# Patient Record
Sex: Male | Born: 1965 | Hispanic: Yes | Marital: Married | Smoking: Never smoker
Health system: Southern US, Community
[De-identification: ages and names within clinical notes are randomized; demographics above are authoritative.]

## PROBLEM LIST (undated history)

## (undated) DIAGNOSIS — I10 Essential (primary) hypertension: Secondary | ICD-10-CM

---

## 2006-07-21 ENCOUNTER — Emergency Department: Payer: Self-pay | Admitting: Emergency Medicine

## 2011-06-02 ENCOUNTER — Ambulatory Visit: Payer: Self-pay | Admitting: Family Medicine

## 2012-12-29 ENCOUNTER — Emergency Department: Payer: Self-pay | Admitting: Emergency Medicine

## 2012-12-29 LAB — URINALYSIS, COMPLETE
Bacteria: NONE SEEN
Blood: NEGATIVE
Nitrite: NEGATIVE
Ph: 5 (ref 4.5–8.0)
Squamous Epithelial: NONE SEEN

## 2012-12-31 ENCOUNTER — Ambulatory Visit: Payer: Self-pay | Admitting: Family Medicine

## 2013-08-02 DIAGNOSIS — I4891 Unspecified atrial fibrillation: Secondary | ICD-10-CM | POA: Diagnosis present
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
## 2016-04-26 DIAGNOSIS — I712 Thoracic aortic aneurysm, without rupture: Secondary | ICD-10-CM | POA: Diagnosis not present

## 2016-04-26 DIAGNOSIS — I480 Paroxysmal atrial fibrillation: Secondary | ICD-10-CM | POA: Diagnosis not present

## 2016-04-26 DIAGNOSIS — I359 Nonrheumatic aortic valve disorder, unspecified: Secondary | ICD-10-CM | POA: Diagnosis not present

## 2016-05-12 DIAGNOSIS — I4891 Unspecified atrial fibrillation: Secondary | ICD-10-CM | POA: Diagnosis not present

## 2016-05-25 DIAGNOSIS — I359 Nonrheumatic aortic valve disorder, unspecified: Secondary | ICD-10-CM | POA: Diagnosis not present

## 2016-05-27 DIAGNOSIS — I359 Nonrheumatic aortic valve disorder, unspecified: Secondary | ICD-10-CM | POA: Diagnosis not present

## 2016-05-27 DIAGNOSIS — J069 Acute upper respiratory infection, unspecified: Secondary | ICD-10-CM | POA: Diagnosis not present

## 2016-06-08 DIAGNOSIS — I359 Nonrheumatic aortic valve disorder, unspecified: Secondary | ICD-10-CM | POA: Diagnosis not present

## 2016-06-16 DIAGNOSIS — I4891 Unspecified atrial fibrillation: Secondary | ICD-10-CM | POA: Diagnosis not present

## 2016-06-29 DIAGNOSIS — H6692 Otitis media, unspecified, left ear: Secondary | ICD-10-CM | POA: Diagnosis not present

## 2016-06-29 DIAGNOSIS — J069 Acute upper respiratory infection, unspecified: Secondary | ICD-10-CM | POA: Diagnosis not present

## 2016-06-29 DIAGNOSIS — Z7901 Long term (current) use of anticoagulants: Secondary | ICD-10-CM | POA: Diagnosis not present

## 2016-06-29 DIAGNOSIS — I38 Endocarditis, valve unspecified: Secondary | ICD-10-CM | POA: Diagnosis not present

## 2016-07-26 DIAGNOSIS — I4891 Unspecified atrial fibrillation: Secondary | ICD-10-CM | POA: Diagnosis not present

## 2016-08-13 DIAGNOSIS — I4891 Unspecified atrial fibrillation: Secondary | ICD-10-CM | POA: Diagnosis not present

## 2016-08-26 DIAGNOSIS — Z5181 Encounter for therapeutic drug level monitoring: Secondary | ICD-10-CM | POA: Diagnosis not present

## 2016-08-26 DIAGNOSIS — Z7901 Long term (current) use of anticoagulants: Secondary | ICD-10-CM | POA: Diagnosis not present

## 2016-09-14 DIAGNOSIS — I4891 Unspecified atrial fibrillation: Secondary | ICD-10-CM | POA: Diagnosis not present

## 2016-09-28 DIAGNOSIS — I4891 Unspecified atrial fibrillation: Secondary | ICD-10-CM | POA: Diagnosis not present

## 2016-10-11 DIAGNOSIS — Z5181 Encounter for therapeutic drug level monitoring: Secondary | ICD-10-CM | POA: Diagnosis not present

## 2016-10-11 DIAGNOSIS — Z7901 Long term (current) use of anticoagulants: Secondary | ICD-10-CM | POA: Diagnosis not present

## 2016-11-04 DIAGNOSIS — Z8774 Personal history of (corrected) congenital malformations of heart and circulatory system: Secondary | ICD-10-CM | POA: Diagnosis not present

## 2016-11-04 DIAGNOSIS — I4891 Unspecified atrial fibrillation: Secondary | ICD-10-CM | POA: Diagnosis not present

## 2016-11-04 DIAGNOSIS — I350 Nonrheumatic aortic (valve) stenosis: Secondary | ICD-10-CM | POA: Diagnosis not present

## 2016-11-28 DIAGNOSIS — Z23 Encounter for immunization: Secondary | ICD-10-CM | POA: Diagnosis not present

## 2016-11-29 DIAGNOSIS — I4891 Unspecified atrial fibrillation: Secondary | ICD-10-CM | POA: Diagnosis not present

## 2016-12-29 DIAGNOSIS — Z5181 Encounter for therapeutic drug level monitoring: Secondary | ICD-10-CM | POA: Diagnosis not present

## 2016-12-29 DIAGNOSIS — Z7901 Long term (current) use of anticoagulants: Secondary | ICD-10-CM | POA: Diagnosis not present

## 2017-01-29 DIAGNOSIS — J019 Acute sinusitis, unspecified: Secondary | ICD-10-CM | POA: Diagnosis not present

## 2017-01-29 DIAGNOSIS — Z7901 Long term (current) use of anticoagulants: Secondary | ICD-10-CM | POA: Diagnosis not present

## 2017-01-29 DIAGNOSIS — B9689 Other specified bacterial agents as the cause of diseases classified elsewhere: Secondary | ICD-10-CM | POA: Diagnosis not present

## 2017-02-02 DIAGNOSIS — Z952 Presence of prosthetic heart valve: Secondary | ICD-10-CM | POA: Diagnosis not present

## 2017-02-14 DIAGNOSIS — Z7901 Long term (current) use of anticoagulants: Secondary | ICD-10-CM | POA: Diagnosis not present

## 2017-03-15 DIAGNOSIS — Z952 Presence of prosthetic heart valve: Secondary | ICD-10-CM | POA: Diagnosis not present

## 2017-04-19 DIAGNOSIS — Z952 Presence of prosthetic heart valve: Secondary | ICD-10-CM | POA: Diagnosis not present

## 2017-04-21 DIAGNOSIS — I10 Essential (primary) hypertension: Secondary | ICD-10-CM | POA: Diagnosis not present

## 2017-04-28 DIAGNOSIS — Z8774 Personal history of (corrected) congenital malformations of heart and circulatory system: Secondary | ICD-10-CM | POA: Diagnosis not present

## 2017-04-28 DIAGNOSIS — Z0000 Encounter for general adult medical examination without abnormal findings: Secondary | ICD-10-CM | POA: Diagnosis not present

## 2017-05-25 DIAGNOSIS — Z952 Presence of prosthetic heart valve: Secondary | ICD-10-CM | POA: Diagnosis not present

## 2017-05-26 DIAGNOSIS — I359 Nonrheumatic aortic valve disorder, unspecified: Secondary | ICD-10-CM | POA: Diagnosis not present

## 2017-05-26 DIAGNOSIS — R791 Abnormal coagulation profile: Secondary | ICD-10-CM | POA: Diagnosis not present

## 2017-05-26 DIAGNOSIS — I350 Nonrheumatic aortic (valve) stenosis: Secondary | ICD-10-CM | POA: Diagnosis not present

## 2017-07-18 DIAGNOSIS — Z952 Presence of prosthetic heart valve: Secondary | ICD-10-CM | POA: Diagnosis not present

## 2017-07-18 DIAGNOSIS — K122 Cellulitis and abscess of mouth: Secondary | ICD-10-CM | POA: Diagnosis not present

## 2017-08-02 DIAGNOSIS — R791 Abnormal coagulation profile: Secondary | ICD-10-CM | POA: Diagnosis not present

## 2017-08-30 DIAGNOSIS — Z952 Presence of prosthetic heart valve: Secondary | ICD-10-CM | POA: Diagnosis not present

## 2017-10-06 DIAGNOSIS — R791 Abnormal coagulation profile: Secondary | ICD-10-CM | POA: Diagnosis not present

## 2017-10-20 DIAGNOSIS — Z8774 Personal history of (corrected) congenital malformations of heart and circulatory system: Secondary | ICD-10-CM | POA: Diagnosis not present

## 2017-10-20 DIAGNOSIS — Z0000 Encounter for general adult medical examination without abnormal findings: Secondary | ICD-10-CM | POA: Diagnosis not present

## 2017-10-20 DIAGNOSIS — R791 Abnormal coagulation profile: Secondary | ICD-10-CM | POA: Diagnosis not present

## 2017-11-12 DIAGNOSIS — M5442 Lumbago with sciatica, left side: Secondary | ICD-10-CM | POA: Diagnosis not present

## 2017-11-23 DIAGNOSIS — I749 Embolism and thrombosis of unspecified artery: Secondary | ICD-10-CM | POA: Diagnosis not present

## 2017-11-23 DIAGNOSIS — I359 Nonrheumatic aortic valve disorder, unspecified: Secondary | ICD-10-CM | POA: Diagnosis not present

## 2017-11-23 DIAGNOSIS — I480 Paroxysmal atrial fibrillation: Secondary | ICD-10-CM | POA: Diagnosis not present

## 2017-12-22 DIAGNOSIS — I10 Essential (primary) hypertension: Secondary | ICD-10-CM | POA: Diagnosis not present

## 2017-12-22 DIAGNOSIS — Z952 Presence of prosthetic heart valve: Secondary | ICD-10-CM | POA: Diagnosis not present

## 2017-12-22 DIAGNOSIS — R5383 Other fatigue: Secondary | ICD-10-CM | POA: Diagnosis not present

## 2017-12-22 DIAGNOSIS — R5381 Other malaise: Secondary | ICD-10-CM | POA: Diagnosis not present

## 2018-01-23 DIAGNOSIS — Z952 Presence of prosthetic heart valve: Secondary | ICD-10-CM | POA: Diagnosis not present

## 2018-02-20 DIAGNOSIS — R509 Fever, unspecified: Secondary | ICD-10-CM | POA: Diagnosis not present

## 2018-02-20 DIAGNOSIS — R52 Pain, unspecified: Secondary | ICD-10-CM | POA: Diagnosis not present

## 2018-02-20 DIAGNOSIS — J069 Acute upper respiratory infection, unspecified: Secondary | ICD-10-CM | POA: Diagnosis not present

## 2018-03-13 DIAGNOSIS — R791 Abnormal coagulation profile: Secondary | ICD-10-CM | POA: Diagnosis not present

## 2018-03-20 DIAGNOSIS — R791 Abnormal coagulation profile: Secondary | ICD-10-CM | POA: Diagnosis not present

## 2018-03-20 DIAGNOSIS — Z23 Encounter for immunization: Secondary | ICD-10-CM | POA: Diagnosis not present

## 2018-04-27 DIAGNOSIS — R791 Abnormal coagulation profile: Secondary | ICD-10-CM | POA: Diagnosis not present

## 2018-05-20 DIAGNOSIS — K644 Residual hemorrhoidal skin tags: Secondary | ICD-10-CM | POA: Diagnosis not present

## 2018-05-30 DIAGNOSIS — E538 Deficiency of other specified B group vitamins: Secondary | ICD-10-CM | POA: Diagnosis not present

## 2018-05-30 DIAGNOSIS — K644 Residual hemorrhoidal skin tags: Secondary | ICD-10-CM | POA: Diagnosis not present

## 2018-05-30 DIAGNOSIS — I480 Paroxysmal atrial fibrillation: Secondary | ICD-10-CM | POA: Diagnosis not present

## 2018-05-30 DIAGNOSIS — Z8774 Personal history of (corrected) congenital malformations of heart and circulatory system: Secondary | ICD-10-CM | POA: Diagnosis not present

## 2018-05-31 DIAGNOSIS — I480 Paroxysmal atrial fibrillation: Secondary | ICD-10-CM | POA: Diagnosis not present

## 2018-05-31 DIAGNOSIS — Z8774 Personal history of (corrected) congenital malformations of heart and circulatory system: Secondary | ICD-10-CM | POA: Diagnosis not present

## 2018-05-31 DIAGNOSIS — I359 Nonrheumatic aortic valve disorder, unspecified: Secondary | ICD-10-CM | POA: Diagnosis not present

## 2018-07-06 DIAGNOSIS — I480 Paroxysmal atrial fibrillation: Secondary | ICD-10-CM | POA: Diagnosis not present

## 2018-07-06 DIAGNOSIS — Z7901 Long term (current) use of anticoagulants: Secondary | ICD-10-CM | POA: Diagnosis not present

## 2018-08-08 DIAGNOSIS — Z7901 Long term (current) use of anticoagulants: Secondary | ICD-10-CM | POA: Diagnosis not present

## 2018-08-08 DIAGNOSIS — K922 Gastrointestinal hemorrhage, unspecified: Secondary | ICD-10-CM | POA: Diagnosis not present

## 2018-08-08 DIAGNOSIS — Z7982 Long term (current) use of aspirin: Secondary | ICD-10-CM | POA: Diagnosis not present

## 2018-08-08 DIAGNOSIS — K6289 Other specified diseases of anus and rectum: Secondary | ICD-10-CM | POA: Diagnosis not present

## 2018-08-08 DIAGNOSIS — Z5181 Encounter for therapeutic drug level monitoring: Secondary | ICD-10-CM | POA: Diagnosis not present

## 2018-08-08 DIAGNOSIS — K625 Hemorrhage of anus and rectum: Secondary | ICD-10-CM | POA: Diagnosis not present

## 2018-08-08 DIAGNOSIS — K644 Residual hemorrhoidal skin tags: Secondary | ICD-10-CM | POA: Diagnosis not present

## 2018-08-08 DIAGNOSIS — K649 Unspecified hemorrhoids: Secondary | ICD-10-CM | POA: Diagnosis not present

## 2018-08-09 DIAGNOSIS — K6289 Other specified diseases of anus and rectum: Secondary | ICD-10-CM | POA: Diagnosis not present

## 2018-08-09 DIAGNOSIS — K625 Hemorrhage of anus and rectum: Secondary | ICD-10-CM | POA: Diagnosis not present

## 2018-08-09 DIAGNOSIS — Z7901 Long term (current) use of anticoagulants: Secondary | ICD-10-CM | POA: Diagnosis not present

## 2018-08-09 DIAGNOSIS — Z7982 Long term (current) use of aspirin: Secondary | ICD-10-CM | POA: Diagnosis not present

## 2018-08-15 DIAGNOSIS — I480 Paroxysmal atrial fibrillation: Secondary | ICD-10-CM | POA: Diagnosis not present

## 2018-08-15 DIAGNOSIS — Z7901 Long term (current) use of anticoagulants: Secondary | ICD-10-CM | POA: Diagnosis not present

## 2018-08-22 DIAGNOSIS — D689 Coagulation defect, unspecified: Secondary | ICD-10-CM | POA: Diagnosis not present

## 2019-07-03 ENCOUNTER — Emergency Department
Admission: EM | Admit: 2019-07-03 | Discharge: 2019-07-03 | Disposition: A | Discharge status: Home / Self Care | Payer: BLUE CROSS/BLUE SHIELD | Attending: Student in an Organized Health Care Education/Training Program | Admitting: Student in an Organized Health Care Education/Training Program

## 2019-07-03 ENCOUNTER — Other Ambulatory Visit: Payer: Self-pay

## 2019-07-03 ENCOUNTER — Emergency Department: Payer: BLUE CROSS/BLUE SHIELD

## 2019-07-03 DIAGNOSIS — Y92410 Unspecified street and highway as the place of occurrence of the external cause: Secondary | ICD-10-CM | POA: Diagnosis not present

## 2019-07-03 DIAGNOSIS — Y999 Unspecified external cause status: Secondary | ICD-10-CM | POA: Insufficient documentation

## 2019-07-03 DIAGNOSIS — Y939 Activity, unspecified: Secondary | ICD-10-CM | POA: Diagnosis not present

## 2019-07-03 DIAGNOSIS — S60222A Contusion of left hand, initial encounter: Secondary | ICD-10-CM | POA: Diagnosis not present

## 2019-07-03 DIAGNOSIS — S6000XA Contusion of unspecified finger without damage to nail, initial encounter: Secondary | ICD-10-CM

## 2019-07-03 DIAGNOSIS — S6992XA Unspecified injury of left wrist, hand and finger(s), initial encounter: Secondary | ICD-10-CM | POA: Diagnosis present

## 2019-07-03 LAB — CBC
HCT: 45 % (ref 39.0–52.0)
Hemoglobin: 15.1 g/dL (ref 13.0–17.0)
MCH: 30.9 pg (ref 26.0–34.0)
MCHC: 33.6 g/dL (ref 30.0–36.0)
MCV: 92 fL (ref 80.0–100.0)
Platelets: 139 10*3/uL — ABNORMAL LOW (ref 150–400)
RBC: 4.89 MIL/uL (ref 4.22–5.81)
RDW: 13.4 % (ref 11.5–15.5)
WBC: 5.2 10*3/uL (ref 4.0–10.5)
nRBC: 0 % (ref 0.0–0.2)

## 2019-07-03 LAB — TROPONIN I (HIGH SENSITIVITY)
Troponin I (High Sensitivity): 11 ng/L (ref <18)
Troponin I (High Sensitivity): 11 ng/L (ref <18)

## 2019-07-03 LAB — BASIC METABOLIC PANEL
Anion gap: 7 (ref 5–15)
BUN: 26 mg/dL — ABNORMAL HIGH (ref 6–20)
CO2: 25 mmol/L (ref 22–32)
Calcium: 8.6 mg/dL — ABNORMAL LOW (ref 8.9–10.3)
Chloride: 111 mmol/L (ref 98–111)
Creatinine, Ser: 0.91 mg/dL (ref 0.61–1.24)
GFR calc Af Amer: >60 mL/min (ref >60)
GFR calc non Af Amer: >60 mL/min (ref >60)
Glucose, Bld: 98 mg/dL (ref 70–99)
Potassium: 3.8 mmol/L (ref 3.5–5.1)
Sodium: 143 mmol/L (ref 135–145)

## 2019-07-03 MED ORDER — SODIUM CHLORIDE 0.9% FLUSH: 3.0000 mL | Freq: Once | INTRAVENOUS | Status: DC
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
## 2021-10-02 DIAGNOSIS — Z952 Presence of prosthetic heart valve: Secondary | ICD-10-CM
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
## 2022-06-26 ENCOUNTER — Emergency Department: Payer: BC Managed Care – PPO

## 2022-06-26 ENCOUNTER — Other Ambulatory Visit: Payer: Self-pay

## 2022-06-26 ENCOUNTER — Observation Stay
Admission: EM | Admit: 2022-06-26 | Discharge: 2022-06-27 | Disposition: A | Discharge status: Home / Self Care | Payer: BC Managed Care – PPO | Attending: Internal Medicine | Admitting: Internal Medicine

## 2022-06-26 ENCOUNTER — Encounter: Payer: Self-pay | Admitting: Intensive Care

## 2022-06-26 DIAGNOSIS — I10 Essential (primary) hypertension: Secondary | ICD-10-CM | POA: Diagnosis present

## 2022-06-26 DIAGNOSIS — B159 Hepatitis A without hepatic coma: Secondary | ICD-10-CM | POA: Diagnosis present

## 2022-06-26 DIAGNOSIS — I471 Supraventricular tachycardia, unspecified: Secondary | ICD-10-CM | POA: Diagnosis present

## 2022-06-26 DIAGNOSIS — R7989 Other specified abnormal findings of blood chemistry: Secondary | ICD-10-CM | POA: Diagnosis not present

## 2022-06-26 DIAGNOSIS — Z8679 Personal history of other diseases of the circulatory system: Secondary | ICD-10-CM | POA: Diagnosis not present

## 2022-06-26 DIAGNOSIS — R9431 Abnormal electrocardiogram [ECG] [EKG]: Secondary | ICD-10-CM | POA: Diagnosis not present

## 2022-06-26 DIAGNOSIS — E872 Acidosis, unspecified: Secondary | ICD-10-CM | POA: Diagnosis not present

## 2022-06-26 DIAGNOSIS — Z7901 Long term (current) use of anticoagulants: Secondary | ICD-10-CM

## 2022-06-26 DIAGNOSIS — R000 Tachycardia, unspecified: Principal | ICD-10-CM

## 2022-06-26 DIAGNOSIS — R21 Rash and other nonspecific skin eruption: Secondary | ICD-10-CM | POA: Diagnosis not present

## 2022-06-26 DIAGNOSIS — I4891 Unspecified atrial fibrillation: Secondary | ICD-10-CM | POA: Diagnosis present

## 2022-06-26 DIAGNOSIS — Z952 Presence of prosthetic heart valve: Secondary | ICD-10-CM

## 2022-06-26 DIAGNOSIS — R072 Precordial pain: Secondary | ICD-10-CM | POA: Diagnosis not present

## 2022-06-26 DIAGNOSIS — R079 Chest pain, unspecified: Secondary | ICD-10-CM | POA: Diagnosis present

## 2022-06-26 DIAGNOSIS — I4892 Unspecified atrial flutter: Secondary | ICD-10-CM

## 2022-06-26 HISTORY — DX: Essential (primary) hypertension: I10

## 2022-06-26 LAB — CBC WITH DIFFERENTIAL/PLATELET
Abs Immature Granulocytes: 0.01 10*3/uL (ref 0.00–0.07)
Basophils Absolute: 0 10*3/uL (ref 0.0–0.1)
Basophils Relative: 0 %
Eosinophils Absolute: 0.4 10*3/uL (ref 0.0–0.5)
Eosinophils Relative: 7 %
HCT: 47 % (ref 39.0–52.0)
Hemoglobin: 15.5 g/dL (ref 13.0–17.0)
Immature Granulocytes: 0 %
Lymphocytes Relative: 26 %
Lymphs Abs: 1.7 10*3/uL (ref 0.7–4.0)
MCH: 29.8 pg (ref 26.0–34.0)
MCHC: 33 g/dL (ref 30.0–36.0)
MCV: 90.4 fL (ref 80.0–100.0)
Monocytes Absolute: 0.9 10*3/uL (ref 0.1–1.0)
Monocytes Relative: 14 %
Neutro Abs: 3.5 10*3/uL (ref 1.7–7.7)
Neutrophils Relative %: 53 %
Platelets: 202 10*3/uL (ref 150–400)
RBC: 5.2 MIL/uL (ref 4.22–5.81)
RDW: 13.5 % (ref 11.5–15.5)
WBC: 6.6 10*3/uL (ref 4.0–10.5)
nRBC: 0 % (ref 0.0–0.2)

## 2022-06-26 LAB — URINALYSIS, W/ REFLEX TO CULTURE (INFECTION SUSPECTED)
Bacteria, UA: NONE SEEN
Bilirubin Urine: NEGATIVE
Glucose, UA: NEGATIVE mg/dL
Hgb urine dipstick: NEGATIVE
Ketones, ur: NEGATIVE mg/dL
Leukocytes,Ua: NEGATIVE
Nitrite: NEGATIVE
Protein, ur: NEGATIVE mg/dL
Specific Gravity, Urine: 1.009 (ref 1.005–1.030)
Squamous Epithelial / HPF: NONE SEEN /HPF (ref 0–5)
pH: 5 (ref 5.0–8.0)

## 2022-06-26 LAB — TYPE AND SCREEN
ABO/RH(D): B POS
Antibody Screen: NEGATIVE

## 2022-06-26 LAB — URINE DRUG SCREEN, QUALITATIVE (ARMC ONLY)
Amphetamines, Ur Screen: NOT DETECTED
Barbiturates, Ur Screen: NOT DETECTED
Benzodiazepine, Ur Scrn: NOT DETECTED
Cannabinoid 50 Ng, Ur ~~LOC~~: NOT DETECTED
Cocaine Metabolite,Ur ~~LOC~~: NOT DETECTED
MDMA (Ecstasy)Ur Screen: NOT DETECTED
Methadone Scn, Ur: NOT DETECTED
Opiate, Ur Screen: NOT DETECTED
Phencyclidine (PCP) Ur S: NOT DETECTED
Tricyclic, Ur Screen: NOT DETECTED

## 2022-06-26 LAB — APTT: aPTT: 60 seconds — ABNORMAL HIGH (ref 24–36)

## 2022-06-26 LAB — URINALYSIS, ROUTINE W REFLEX MICROSCOPIC
Bilirubin Urine: NEGATIVE
Glucose, UA: NEGATIVE mg/dL
Hgb urine dipstick: NEGATIVE
Ketones, ur: NEGATIVE mg/dL
Leukocytes,Ua: NEGATIVE
Nitrite: NEGATIVE
Protein, ur: NEGATIVE mg/dL
Specific Gravity, Urine: 1.006 (ref 1.005–1.030)
pH: 5 (ref 5.0–8.0)

## 2022-06-26 LAB — TROPONIN I (HIGH SENSITIVITY)
Troponin I (High Sensitivity): 10 ng/L (ref <18)
Troponin I (High Sensitivity): 15 ng/L (ref <18)

## 2022-06-26 LAB — COMPREHENSIVE METABOLIC PANEL
ALT: 242 U/L — ABNORMAL HIGH (ref 0–44)
AST: 87 U/L — ABNORMAL HIGH (ref 15–41)
Albumin: 3.4 g/dL — ABNORMAL LOW (ref 3.5–5.0)
Alkaline Phosphatase: 145 U/L — ABNORMAL HIGH (ref 38–126)
Anion gap: 4 — ABNORMAL LOW (ref 5–15)
BUN: 24 mg/dL — ABNORMAL HIGH (ref 6–20)
CO2: 21 mmol/L — ABNORMAL LOW (ref 22–32)
Calcium: 8.7 mg/dL — ABNORMAL LOW (ref 8.9–10.3)
Chloride: 110 mmol/L (ref 98–111)
Creatinine, Ser: 0.88 mg/dL (ref 0.61–1.24)
GFR, Estimated: >60 mL/min (ref >60)
Glucose, Bld: 118 mg/dL — ABNORMAL HIGH (ref 70–99)
Potassium: 3.8 mmol/L (ref 3.5–5.1)
Sodium: 135 mmol/L (ref 135–145)
Total Bilirubin: 0.8 mg/dL (ref 0.3–1.2)
Total Protein: 7.7 g/dL (ref 6.5–8.1)

## 2022-06-26 LAB — PROTIME-INR
INR: 2.3 — ABNORMAL HIGH (ref 0.8–1.2)
Prothrombin Time: 25.4 seconds — ABNORMAL HIGH (ref 11.4–15.2)

## 2022-06-26 LAB — BLOOD GAS, VENOUS

## 2022-06-26 LAB — LACTIC ACID, PLASMA
Lactic Acid, Venous: 1.5 mmol/L (ref 0.5–1.9)
Lactic Acid, Venous: 1.1 mmol/L (ref 0.5–1.9)

## 2022-06-26 LAB — CK: Total CK: 137 U/L (ref 49–397)

## 2022-06-26 LAB — TSH: TSH: 1.592 u[IU]/mL (ref 0.350–4.500)

## 2022-06-26 MED ORDER — SODIUM CHLORIDE 0.9% FLUSH
3.0000 mL | Freq: Two times a day (BID) | INTRAVENOUS | Status: DC
  Administered 2022-06-26 – 2022-06-27 (×2): 3 mL via INTRAVENOUS

## 2022-06-26 MED ORDER — FENTANYL CITRATE (PF) 100 MCG/2ML IJ SOLN
INTRAMUSCULAR | Status: AC | PRN
  Administered 2022-06-26: 50 ug via INTRAVENOUS

## 2022-06-26 MED ORDER — SODIUM CHLORIDE 0.9 % IV BOLUS
1000.0000 mL | Freq: Once | INTRAVENOUS | Status: AC
  Administered 2022-06-26: 1000 mL via INTRAVENOUS

## 2022-06-26 MED ORDER — AMIODARONE HCL IN DEXTROSE 360-4.14 MG/200ML-% IV SOLN
30.0000 mg/h | INTRAVENOUS | Status: DC
  Administered 2022-06-27: 30 mg/h via INTRAVENOUS
  Filled 2022-06-26: qty 200

## 2022-06-26 MED ORDER — SODIUM CHLORIDE 0.9 % IV BOLUS (SEPSIS)
1000.0000 mL | Freq: Once | INTRAVENOUS | Status: AC
  Administered 2022-06-26: 1000 mL via INTRAVENOUS

## 2022-06-26 MED ORDER — ORAL CARE MOUTH RINSE: 15.0000 mL | OROMUCOSAL | Status: DC | PRN

## 2022-06-26 MED ORDER — ACETAMINOPHEN 650 MG RE SUPP: 650.0000 mg | Freq: Four times a day (QID) | RECTAL | Status: DC | PRN

## 2022-06-26 MED ORDER — FENTANYL CITRATE PF 50 MCG/ML IJ SOSY
50.0000 ug | PREFILLED_SYRINGE | Freq: Once | INTRAMUSCULAR | Status: DC
  Filled 2022-06-26: qty 1

## 2022-06-26 MED ORDER — AMIODARONE LOAD VIA INFUSION
150.0000 mg | Freq: Once | INTRAVENOUS | Status: AC
  Administered 2022-06-26: 150 mg via INTRAVENOUS
  Filled 2022-06-26: qty 83.34

## 2022-06-26 MED ORDER — AMIODARONE HCL IN DEXTROSE 360-4.14 MG/200ML-% IV SOLN
60.0000 mg/h | INTRAVENOUS | Status: DC
  Administered 2022-06-26 (×2): 60 mg/h via INTRAVENOUS
  Filled 2022-06-26 (×2): qty 200

## 2022-06-26 MED ORDER — IOHEXOL 350 MG/ML SOLN
75.0000 mL | Freq: Once | INTRAVENOUS | Status: AC | PRN
  Administered 2022-06-26: 75 mL via INTRAVENOUS

## 2022-06-26 MED ORDER — ACETAMINOPHEN 325 MG PO TABS
650.0000 mg | ORAL_TABLET | Freq: Four times a day (QID) | ORAL | Status: DC | PRN
  Administered 2022-06-27: 650 mg via ORAL
  Filled 2022-06-26: qty 2

## 2022-06-26 MED ORDER — METOPROLOL TARTRATE 5 MG/5ML IV SOLN
5.0000 mg | INTRAVENOUS | Status: DC | PRN
  Administered 2022-06-26 (×2): 5 mg via INTRAVENOUS
  Filled 2022-06-26 (×2): qty 5

## 2022-06-26 MED ORDER — DILTIAZEM HCL 25 MG/5ML IV SOLN
20.0000 mg | Freq: Once | INTRAVENOUS | Status: AC
  Administered 2022-06-26: 20 mg via INTRAVENOUS
  Filled 2022-06-26: qty 5

## 2022-06-26 MED ORDER — ETOMIDATE 2 MG/ML IV SOLN
INTRAVENOUS | Status: AC | PRN
  Administered 2022-06-26: 20 mg via INTRAVENOUS

## 2022-06-26 MED ORDER — ETOMIDATE 2 MG/ML IV SOLN
0.3000 mg/kg | Freq: Once | INTRAVENOUS | Status: DC
  Filled 2022-06-26: qty 20

## 2022-06-26 NOTE — Sedation Documentation (Signed)
Pt cardioverted at 200J

## 2022-06-26 NOTE — H&P (Signed)
History and Physical    Chief Complaint: Chest pain.   HISTORY OF PRESENT ILLNESS: Brent Reynolds is an 57 y.o. male  seen for SOB/ palpitation and chest pian.  Pt is a 57 y/o Hispanic Spanish speaking male with h/o AV replacement and ascending aneurysm repair at [facility] on Coumadin coming to ED for Chest pain / SOB/ palpitation since his trip in [place] about 2-3 weeks ago. Pt also states his symptoms did start there in [place] and got worse. His palpitation today is worse along with SOB and chest pain. Pt also noticed a fine lacy rash that started in both his hands and has spread to his whole body. No Other symptoms or fever of GI or urinary issues. On arrival pt meets sepsis criteria and was in SVT with RH in 150's and was shocked with 200j x 2 and started on amiodarone and metoprolol per cardiology Dr.Callwood. Pt was given 3 L NS.    Pt has  Past Medical History:  Diagnosis Date   Hypertension     Review of Systems  Respiratory:  Positive for shortness of breath.   Cardiovascular:  Positive for chest pain and palpitations.  Skin:  Positive for rash.  Neurological:  Positive for headaches.   No Known Allergies History reviewed. No pertinent surgical history.     MEDICATIONS: No current outpatient medications   etomidate  0.3 mg/kg Intravenous Once   fentaNYL (SUBLIMAZE) injection  50 mcg Intravenous Once     amiodarone 60 mg/hr (06/26/22 2118)   Followed by   Derrill Memo ON 06/27/2022] amiodarone      iohexol, metoprolol tartrate    ED Course: Pt in Ed *** Vitals:   06/26/22 2015 06/26/22 2030 06/26/22 2045 06/26/22 2100  BP: (!) 126/91 (!) 115/94 (!) 119/99 (!) 132/98  Pulse: (!) 132 (!) 135 (!) 135 (!) 135  Resp: (!) 28 (!) 30 (!) 26 19  Temp:      TempSrc:      SpO2: 99% 96% 98% 99%  Weight:      Height:        No intake/output data recorded. SpO2: 99 % O2 Flow Rate (L/min): 2 L/min Blood work in ed shows: Results for orders placed or performed during the  hospital encounter of 06/26/22 (from the past 24 hour(s))  Lactic acid, plasma     Status: None   Collection Time: 06/26/22 12:02 PM  Result Value Ref Range   Lactic Acid, Venous 1.5 0.5 - 1.9 mmol/L  Comprehensive metabolic panel     Status: Abnormal   Collection Time: 06/26/22 12:02 PM  Result Value Ref Range   Sodium 135 135 - 145 mmol/L   Potassium 3.8 3.5 - 5.1 mmol/L   Chloride 110 98 - 111 mmol/L   CO2 21 (L) 22 - 32 mmol/L   Glucose, Bld 118 (H) 70 - 99 mg/dL   BUN 24 (H) 6 - 20 mg/dL   Creatinine, Ser 0.88 0.61 - 1.24 mg/dL   Calcium 8.7 (L) 8.9 - 10.3 mg/dL   Total Protein 7.7 6.5 - 8.1 g/dL   Albumin 3.4 (L) 3.5 - 5.0 g/dL   AST 87 (H) 15 - 41 U/L   ALT 242 (H) 0 - 44 U/L   Alkaline Phosphatase 145 (H) 38 - 126 U/L   Total Bilirubin 0.8 0.3 - 1.2 mg/dL   GFR, Estimated >60 >60 mL/min   Anion gap 4 (L) 5 - 15  CBC with Differential  Status: None   Collection Time: 06/26/22 12:02 PM  Result Value Ref Range   WBC 6.6 4.0 - 10.5 K/uL   RBC 5.20 4.22 - 5.81 MIL/uL   Hemoglobin 15.5 13.0 - 17.0 g/dL   HCT 47.0 39.0 - 52.0 %   MCV 90.4 80.0 - 100.0 fL   MCH 29.8 26.0 - 34.0 pg   MCHC 33.0 30.0 - 36.0 g/dL   RDW 13.5 11.5 - 15.5 %   Platelets 202 150 - 400 K/uL   nRBC 0.0 0.0 - 0.2 %   Neutrophils Relative % 53 %   Neutro Abs 3.5 1.7 - 7.7 K/uL   Lymphocytes Relative 26 %   Lymphs Abs 1.7 0.7 - 4.0 K/uL   Monocytes Relative 14 %   Monocytes Absolute 0.9 0.1 - 1.0 K/uL   Eosinophils Relative 7 %   Eosinophils Absolute 0.4 0.0 - 0.5 K/uL   Basophils Relative 0 %   Basophils Absolute 0.0 0.0 - 0.1 K/uL   Immature Granulocytes 0 %   Abs Immature Granulocytes 0.01 0.00 - 0.07 K/uL  Protime-INR     Status: Abnormal   Collection Time: 06/26/22 12:02 PM  Result Value Ref Range   Prothrombin Time 25.4 (H) 11.4 - 15.2 seconds   INR 2.3 (H) 0.8 - 1.2  APTT     Status: Abnormal   Collection Time: 06/26/22 12:02 PM  Result Value Ref Range   aPTT 60 (H) 24 - 36  seconds  Troponin I (High Sensitivity)     Status: None   Collection Time: 06/26/22 12:02 PM  Result Value Ref Range   Troponin I (High Sensitivity) 10 <18 ng/L  CK     Status: None   Collection Time: 06/26/22 12:02 PM  Result Value Ref Range   Total CK 137 49 - 397 U/L  Urinalysis, Routine w reflex microscopic -Urine, Clean Catch     Status: Abnormal   Collection Time: 06/26/22 12:07 PM  Result Value Ref Range   Color, Urine STRAW (A) YELLOW   APPearance CLEAR (A) CLEAR   Specific Gravity, Urine 1.006 1.005 - 1.030   pH 5.0 5.0 - 8.0   Glucose, UA NEGATIVE NEGATIVE mg/dL   Hgb urine dipstick NEGATIVE NEGATIVE   Bilirubin Urine NEGATIVE NEGATIVE   Ketones, ur NEGATIVE NEGATIVE mg/dL   Protein, ur NEGATIVE NEGATIVE mg/dL   Nitrite NEGATIVE NEGATIVE   Leukocytes,Ua NEGATIVE NEGATIVE  Urinalysis, w/ Reflex to Culture (Infection Suspected) -Urine, Clean Catch     Status: Abnormal   Collection Time: 06/26/22 12:07 PM  Result Value Ref Range   Specimen Source URINE, CLEAN CATCH    Color, Urine STRAW (A) YELLOW   APPearance CLEAR (A) CLEAR   Specific Gravity, Urine 1.009 1.005 - 1.030   pH 5.0 5.0 - 8.0   Glucose, UA NEGATIVE NEGATIVE mg/dL   Hgb urine dipstick NEGATIVE NEGATIVE   Bilirubin Urine NEGATIVE NEGATIVE   Ketones, ur NEGATIVE NEGATIVE mg/dL   Protein, ur NEGATIVE NEGATIVE mg/dL   Nitrite NEGATIVE NEGATIVE   Leukocytes,Ua NEGATIVE NEGATIVE   WBC, UA 0-5 0 - 5 WBC/hpf   Bacteria, UA NONE SEEN NONE SEEN   Squamous Epithelial / HPF NONE SEEN 0 - 5 /HPF  Urine Drug Screen, Qualitative     Status: None   Collection Time: 06/26/22 12:07 PM  Result Value Ref Range   Tricyclic, Ur Screen NONE DETECTED NONE DETECTED   Amphetamines, Ur Screen NONE DETECTED NONE DETECTED   MDMA (Ecstasy)Ur Screen  NONE DETECTED NONE DETECTED   Cocaine Metabolite,Ur [place] NONE DETECTED NONE DETECTED   Opiate, Ur Screen NONE DETECTED NONE DETECTED   Phencyclidine (PCP) Ur S NONE DETECTED NONE  DETECTED   Cannabinoid 50 Ng, Ur [place] NONE DETECTED NONE DETECTED   Barbiturates, Ur Screen NONE DETECTED NONE DETECTED   Benzodiazepine, Ur Scrn NONE DETECTED NONE DETECTED   Methadone Scn, Ur NONE DETECTED NONE DETECTED  Lactic acid, plasma     Status: None   Collection Time: 06/26/22  2:14 PM  Result Value Ref Range   Lactic Acid, Venous 1.1 0.5 - 1.9 mmol/L  Troponin I (High Sensitivity)     Status: None   Collection Time: 06/26/22  2:14 PM  Result Value Ref Range   Troponin I (High Sensitivity) 15 <18 ng/L  TSH     Status: None   Collection Time: 06/26/22  2:14 PM  Result Value Ref Range   TSH 1.592 0.350 - 4.500 uIU/mL  Blood gas, venous     Status: Abnormal (Preliminary result)   Collection Time: 06/26/22  6:44 PM  Result Value Ref Range   pH, Ven 7.42 7.25 - 7.43   pCO2, Ven 40 (L) 44 - 60 mmHg   pO2, Ven 56 (H) 32 - 45 mmHg   Bicarbonate 25.9 20.0 - 28.0 mmol/L   Acid-Base Excess 1.3 0.0 - 2.0 mmol/L   O2 Saturation 85.9 %   Patient temperature 37.0    Collection site VENOUS    Drawn by PENDING   Type and screen     Status: None   Collection Time: 06/26/22  7:49 PM  Result Value Ref Range   ABO/RH(D) B POS    Antibody Screen NEG    Sample Expiration      06/29/2022,2359 Performed at [facility], [address] [place], [place] [address]     Unresulted Labs (From admission, onward)     Start     Ordered   06/26/22 1959  Acetaminophen level  Once,   R        06/26/22 1959   06/26/22 1959  Ethanol  Once,   R        06/26/22 1959   06/26/22 1959  D-dimer, quantitative  Once,   R        06/26/22 1959   [id number]  Salicylate level  Once,   R        06/26/22 1959   06/26/22 1930  Beta-hydroxybutyric acid  Once,   AD        06/26/22 1930   06/26/22 1916  Hemoglobin A1c  Add-on,   AD        06/26/22 1919   06/26/22 1849  Hepatitis panel, acute  Once,   URGENT        06/26/22 1849   06/26/22 1530  RPR  Once,   R        06/26/22 1530   06/26/22  1530  HIV Antibody (routine testing w rflx)  Once,   R        06/26/22 1530   06/26/22 1212  Blood Culture (routine x 2)  (Undifferentiated presentation (screening labs and basic nursing orders))  BLOOD CULTURE X 2,   STAT      06/26/22 1212           Pt has received : Orders Placed This Encounter  Procedures   Critical Care    This order was created via procedure documentation  Standing Status:   Standing    Number of Occurrences:   1   1-3 Lead EKG Interpretation    This order was created via procedure documentation    Standing Status:   Standing    Number of Occurrences:   1   ELECTRICAL CARDIOVERSION    This order was created via procedure documentation    Standing Status:   Standing    Number of Occurrences:   1   Sedation procedure    This order was created via procedure documentation    Standing Status:   Standing    Number of Occurrences:   1   Blood Culture (routine x 2)    Standing Status:   Standing    Number of Occurrences:   2   DG Chest Port 1 View    Standing Status:   Standing    Number of Occurrences:   1    Order Specific Question:   Reason for Exam (SYMPTOM  OR DIAGNOSIS REQUIRED)    Answer:   Questionable sepsis - evaluate for abnormality   US ABDOMEN LIMITED RUQ (LIVER/GB)    Standing Status:   Standing    Number of Occurrences:   1    Order Specific Question:   Symptom/Reason for Exam    Answer:   LFTs abnormal [id number]   CT Angio Chest PE W and/or Wo Contrast    Standing Status:   Standing    Number of Occurrences:   1    Order Specific Question:   Does the patient have a contrast media/X-ray dye allergy?    Answer:   No    Order Specific Question:   If indicated for the ordered procedure, I authorize the administration of contrast media per Radiology protocol    Answer:   Yes    Order Specific Question:   Radiology Contrast Protocol - do NOT remove file path    Answer:   [email or web address]   Lactic  acid, plasma    Standing Status:   Standing    Number of Occurrences:   2   Comprehensive metabolic panel    Standing Status:   Standing    Number of Occurrences:   1   CBC with Differential    Standing Status:   Standing    Number of Occurrences:   1   Urinalysis, Routine w reflex microscopic -Urine, Clean Catch    Standing Status:   Standing    Number of Occurrences:   1    Order Specific Question:   Specimen Source    Answer:   Urine, Clean Catch [76]   Protime-INR    Standing Status:   Standing    Number of Occurrences:   1   APTT    Standing Status:   Standing    Number of Occurrences:   1   CK    Standing Status:   Standing    Number of Occurrences:   1   TSH    Standing Status:   Standing    Number of Occurrences:   1   RPR    Standing Status:   Standing    Number of Occurrences:   1   HIV Antibody (routine testing w rflx)    Standing Status:   Standing    Number of Occurrences:   1   Urinalysis, w/ Reflex to Culture (Infection Suspected) -Urine, Clean Catch    Standing Status:   Standing    Number of Occurrences:  1    Order Specific Question:   Specimen Source    Answer:   Urine, Clean Catch [76]   Urine Drug Screen, Qualitative    Standing Status:   Standing    Number of Occurrences:   1   Blood gas, venous    Standing Status:   Standing    Number of Occurrences:   1   Hepatitis panel, acute    Standing Status:   Standing    Number of Occurrences:   1   Hemoglobin A1c    Standing Status:   Standing    Number of Occurrences:   1   Beta-hydroxybutyric acid    Standing Status:   Standing    Number of Occurrences:   1   Acetaminophen level    Standing Status:   Standing    Number of Occurrences:   1   Ethanol    Standing Status:   Standing    Number of Occurrences:   1   D-dimer, quantitative    Standing Status:   Standing    Number of Occurrences:   1   Salicylate level    Standing Status:   Standing    Number of Occurrences:   1   Diet NPO time  specified    Standing Status:   Standing    Number of Occurrences:   1   Cardiac monitoring    Standing Status:   Standing    Number of Occurrences:   1   Document height and weight    Standing Status:   Standing    Number of Occurrences:   1   Assess and Document Glasgow Coma Scale    Standing Status:   Standing    Number of Occurrences:   1   Document vital signs within 1-hour of fluid bolus completion.  Notify provider of abnormal vital signs despite fluid resuscitation.    Standing Status:   Standing    Number of Occurrences:   1   Refer to Sidebar Report: Sepsis Bundle ED/IP    Sepsis Bundle ED/IP    Standing Status:   Standing    Number of Occurrences:   1   Notify provider for difficulties obtaining IV access    Standing Status:   Standing    Number of Occurrences:   1   Initiate Carrier Fluid Protocol    Standing Status:   Standing    Number of Occurrences:   1   Consult to hospitalist    Standing Status:   Standing    Number of Occurrences:   1    Order Specific Question:   Place call to:    Answer:   ED, [phone]    Order Specific Question:   Reason for Consult    Answer:   Admit    Order Specific Question:   Diagnosis/Clinical Info for Consult:    Answer:   atrial flutter   Pulse oximetry, continuous    Standing Status:   Standing    Number of Occurrences:   1   EKG 12-Lead    Standing Status:   Standing    Number of Occurrences:   1   ED EKG 12-Lead    Standing Status:   Standing    Number of Occurrences:   1    Order Specific Question:   Notes    Answer:   Baseline   EKG 12-Lead    Standing Status:   Standing  Number of Occurrences:   1   EKG 12-Lead    Standing Status:   Standing    Number of Occurrences:   1   Type and screen    Standing Status:   Standing    Number of Occurrences:   1   Insert peripheral IV X 1    Angiocath size 20G or larger    Standing Status:   Standing    Number of Occurrences:   1    Meds ordered this encounter   Medications   sodium chloride 0.9 % bolus 1,000 mL   sodium chloride 0.9 % bolus 1,000 mL   sodium chloride 0.9 % bolus 1,000 mL   diltiazem (CARDIZEM) injection 20 mg   etomidate (AMIDATE) injection 25.6 mg   fentaNYL (SUBLIMAZE) injection 50 mcg   fentaNYL (SUBLIMAZE) injection   etomidate (AMIDATE) injection   FOLLOWED BY Linked Order Group    amiodarone (NEXTERONE) 1.8 mg/mL load via infusion 150 mg    amiodarone (NEXTERONE PREMIX) 360-4.14 MG/200ML-% (1.8 mg/mL) IV infusion    amiodarone (NEXTERONE PREMIX) 360-4.14 MG/200ML-% (1.8 mg/mL) IV infusion   metoprolol tartrate (LOPRESSOR) injection 5 mg   iohexol (OMNIPAQUE) 350 MG/ML injection 75 mL     Admission Imaging : US ABDOMEN LIMITED RUQ (LIVER/GB)  Result Date: 06/26/2022 CLINICAL DATA:  Elevated liver function test. EXAM: ULTRASOUND ABDOMEN LIMITED RIGHT UPPER QUADRANT COMPARISON:  None Available. FINDINGS: Gallbladder: No gallstones or wall thickening visualized. No sonographic Murphy sign noted by sonographer. Common bile duct: Diameter: 2.6 mm.  Normal diameter Liver: Mild increased echogenicity. No focal lesion. Portal vein is patent on color Doppler imaging with normal direction of blood flow towards the liver. Other: None. IMPRESSION: 1. Negative for gallstones or biliary obstruction. 2. Mild hepatic steatosis. Electronically Signed   By: Suzy Bouchard M.D.   On: 06/26/2022 14:50   DG Chest Port 1 View  Result Date: 06/26/2022 CLINICAL DATA:  Questionable sepsis. EXAM: PORTABLE CHEST 1 VIEW COMPARISON:  07/03/2019 FINDINGS: The lungs are clear without focal pneumonia, edema, pneumothorax or pleural effusion. Cardiopericardial silhouette is at upper limits of normal for size. The visualized bony structures of the thorax are unremarkable. Telemetry leads overlie the chest. IMPRESSION: No active disease. Electronically Signed   By: Misty Stanley M.D.   On: 06/26/2022 13:03      Physical Examination: Vitals:   06/26/22  2015 06/26/22 2030 06/26/22 2045 06/26/22 2100  BP: (!) 126/91 (!) 115/94 (!) 119/99 (!) 132/98  Pulse: (!) 132 (!) 135 (!) 135 (!) 135  Temp:      Resp: (!) 28 (!) 30 (!) 26 19  Height:      Weight:      SpO2: 99% 96% 98% 99%  TempSrc:      BMI (Calculated):       Physical Exam Vitals and nursing note reviewed.  Constitutional:      General: He is not in acute distress.    Appearance: Normal appearance. He is not ill-appearing, toxic-appearing or diaphoretic.  HENT:     Head: Normocephalic and atraumatic.     Right Ear: Hearing and external ear normal.     Left Ear: Hearing and external ear normal.     Nose: Nose normal. No nasal deformity.     Mouth/Throat:     Lips: Pink.     Mouth: Mucous membranes are moist.     Tongue: No lesions.     Pharynx: Oropharynx is clear.  Eyes:  Extraocular Movements: Extraocular movements intact.  Cardiovascular:     Rate and Rhythm: Normal rate and regular rhythm.     Pulses: Normal pulses.     Heart sounds: Normal heart sounds.  Pulmonary:     Effort: Pulmonary effort is normal.     Breath sounds: Normal breath sounds.  Abdominal:     General: Bowel sounds are normal. There is no distension.     Palpations: Abdomen is soft. There is no mass.     Tenderness: There is no abdominal tenderness. There is no guarding.     Hernia: No hernia is present.  Musculoskeletal:     Right lower leg: No edema.     Left lower leg: No edema.  Skin:    General: Skin is warm.  Neurological:     General: No focal deficit present.     Mental Status: He is alert and oriented to person, place, and time.     Cranial Nerves: Cranial nerves 2-12 are intact.     Motor: Motor function is intact.  Psychiatric:        Attention and Perception: Attention normal.        Mood and Affect: Mood normal.        Speech: Speech normal.        Behavior: Behavior normal. Behavior is cooperative.        Cognition and Memory: Cognition normal.      Assessment and  Plan: No notes have been filed under this hospital service. Service: Hospitalist          DVT prophylaxis:  ***  Code Status:  ***  Family Communication:  ***  Disposition Plan:  ***  Consults called:  *** Admission status: ***  Unit/ Expected LOS: ***   Para Skeans MD [facility]  6 PM- 2 AM. Please contact me via secure Chat 6 PM-2 AM. [phone]( Pager ) To contact the [facility] Attending or Consulting provider [place] or covering provider during after hours [place], for this patient.   Check the care team in [facility] and look for a) attending/consulting [facility] provider listed and b) the [facility] team listed Log into [email or web address] and use [facility] universal password to access. If you do not have the password, please contact the hospital operator. Locate the [facility] provider you are looking for under [facility] and page to a number that you can be directly reached. If you still have difficulty reaching the provider, please page the [facility] (Director on Call) for the Hospitalists listed on amion for assistance. [email or web address] 06/26/2022, 10:11 PM

## 2022-06-26 NOTE — ED Provider Notes (Signed)
.Critical Care  Performed by: Carrie Mew, MD Authorized by: Carrie Mew, MD   Critical care provider statement:    Critical care time (minutes):  40   Critical care time was exclusive of:  Separately billable procedures and treating other patients   Critical care was necessary to treat or prevent imminent or life-threatening deterioration of the following conditions:  Circulatory failure and cardiac failure   Critical care was time spent personally by me on the following activities:  Development of treatment plan with patient or surrogate, discussions with consultants, evaluation of patient's response to treatment, examination of patient, obtaining history from patient or surrogate, ordering and performing treatments and interventions, ordering and review of laboratory studies, ordering and review of radiographic studies, pulse oximetry, re-evaluation of patient's condition and review of old charts   Care discussed with: admitting provider   Comments:         .1-3 Lead EKG Interpretation  Performed by: Carrie Mew, MD Authorized by: Carrie Mew, MD     Interpretation: abnormal     ECG rate:  150   ECG rate assessment: tachycardic     Rhythm: atrial flutter     Ectopy: none     Conduction: normal   Comments:         .Cardioversion  Date/Time: 06/26/2022 8:14 PM  Performed by: Carrie Mew, MD Authorized by: Carrie Mew, MD   Consent:    Consent obtained:  Verbal and written   Consent given by:  Patient   Risks discussed:  Cutaneous burn, death, induced arrhythmia and pain   Alternatives discussed:  Rate-control medication Pre-procedure details:    Cardioversion basis:  Elective   Rhythm:  Atrial flutter   Electrode placement:  Anterior-posterior Patient sedated: Yes. Refer to sedation procedure documentation for details of sedation.  Attempt one:    Cardioversion mode:  Synchronous   Waveform:  Biphasic   Shock (Joules):  200    Shock outcome:  No change in rhythm Attempt two:    Cardioversion mode:  Synchronous   Waveform:  Biphasic   Shock (Joules):  200   Shock outcome:  No change in rhythm Post-procedure details:    Patient status:  Awake   Patient tolerance of procedure:  Tolerated well, no immediate complications Comments:         .Sedation  Date/Time: 06/26/2022 8:16 PM  Performed by: Carrie Mew, MD Authorized by: Carrie Mew, MD   Consent:    Consent obtained:  Written and verbal   Consent given by:  Patient   Risks discussed:  Allergic reaction, dysrhythmia, vomiting, prolonged hypoxia resulting in organ damage and respiratory compromise necessitating ventilatory assistance and intubation   Alternatives discussed:  Analgesia without sedation and anxiolysis Universal protocol:    Immediately prior to procedure, a time out was called: yes     Patient identity confirmed:  Verbally with patient and arm band Indications:    Procedure performed:  Cardioversion   Procedure necessitating sedation performed by:  Physician performing sedation Pre-sedation assessment:    Time since last food or drink:  1 hour   NPO status caution: urgency dictates proceeding with non-ideal NPO status     ASA classification: class 2 - patient with mild systemic disease     Mouth opening:  3 or more finger widths   Thyromental distance:  4 finger widths   Mallampati score:  I - soft palate, uvula, fauces, pillars visible   Neck mobility: normal     Pre-sedation assessments  completed and reviewed: airway patency, cardiovascular function, hydration status, mental status, nausea/vomiting, pain level and respiratory function     Pre-sedation assessment completed:  06/26/2022 5:17 PM Immediate pre-procedure details:    Reassessment: Patient reassessed immediately prior to procedure     Reviewed: vital signs, relevant labs/tests and NPO status     Verified: bag valve mask available, emergency equipment available,  intubation equipment available, IV patency confirmed, oxygen available and suction available   Procedure details (see MAR for exact dosages):    Preoxygenation:  Nasal cannula   Sedation:  Etomidate   Intended level of sedation: deep   Analgesia:  Fentanyl   Intra-procedure monitoring:  Blood pressure monitoring, cardiac monitor, continuous pulse oximetry, continuous capnometry, frequent LOC assessments and frequent vital sign checks   Intra-procedure events: none     Intra-procedure management: jaw thrust for sleep apnea.   Total Provider sedation time (minutes):  15 Post-procedure details:    Post-sedation assessment completed:  06/26/2022 8:18 PM   Attendance: Constant attendance by certified staff until patient recovered     Recovery: Patient returned to pre-procedure baseline     Post-sedation assessments completed and reviewed: airway patency, cardiovascular function, hydration status, mental status, nausea/vomiting, pain level and respiratory function     Patient is stable for discharge or admission: yes     Procedure completion:  Tolerated well, no immediate complications      ----------------------------------------- 5:10 PM on 06/26/2022 ----------------------------------------- Patient remains tachycardic after 3 L IV fluids.  Reviewing EKG, I suspect the patient is in atrial flutter.  He was given a trial of 20 mg IV diltiazem without improvement.  Had a long conversation with the patient about admission for continued attempted rate control pharmacologically versus attempting electrocardioversion.  Risks and benefits were discussed including the failure of either intervention requiring reversion to the other choice.  Patient understands and opts to proceed with electrocardioversion under deep sedation.  I reviewed past records including echocardiogram from 2021 which showed mechanical aortic valve, otherwise normal.  He has been compliant with his warfarin and is therapeutic on INR  today, acceptable candidate for the procedure.   ----------------------------------------- 5:59 PM on 06/26/2022 ----------------------------------------- Cardioversion attempted twice with 200 J biphasic which produced about a 4-second asystolic pause each time during which rhythm strip clearly demonstrated regular P waves at an atrial rate of 150 consistent with atrial flutter.  Cardioversion was unsuccessful.  Will contact cardiology for recommendations and plan to admit.  ----------------------------------------- 6:45 PM on 06/26/2022 ----------------------------------------- Discussed with hospitalist Dr. Posey Pronto who came and evaluated the patient.  She recommends additional labs to help determine appropriate level of care in the hospital, stepdown versus ICU.  ----------------------------------------- 10:11 PM on 06/26/2022 -----------------------------------------  Seen by ICU team who recommend step down admission, not requiring ICU level care at this time.       Carrie Mew, MD 06/26/22 2211

## 2022-06-26 NOTE — ED Notes (Signed)
Pt in CT.

## 2022-06-26 NOTE — ED Provider Notes (Signed)
[facility] Provider Note    Event Date/Time   First MD Initiated Contact with Patient 06/26/22 1201     (approximate)   History   Tachycardia, Hypertension, and Rash   HPI  Brent Reynolds is a 57 y.o. male   Past medical history of aortic valve replacement on Coumadin, hypertension, presents to the emergency department with palpitations, tachycardia, and diffuse skin rash that has been improving since he came home from [place] recently.  First noticed that developed approximately 2-1/2 weeks ago when in [place].  He states that it was very hot and he spent a lot of time outdoors in the heat and did not hydrate well.  He noticed a whole body rash while in [place] and since coming home has gotten markedly improved.  He had some generalized weakness while in [place] that has improved as well.  He denies any focal infectious symptoms like respiratory infectious symptoms, GI or GU complaints.  He has been compliant with his anticoagulation.  Denies nausea vomiting or diarrhea.  He has not had a fever.  His rash and his weakness had been getting better to the point where he did not seek medical care upon coming back to the [place] except for when he came to his dentist appointment for hygiene appointment and they checked his heart rate and blood pressure which was abnormal and sent him to the emergency department.  Currently he has no acute complaints except for feeling that his heart continues to race.  He has no chest pain.  He has no shortness of breath.   External Medical Documents Reviewed: Internal medicine visit dated January 2024 which reviews his past medical history of hypertension and aortic valve replacement.      Physical Exam   Triage Vital Signs: ED Triage Vitals [06/26/22 1153]  Enc Vitals Group     BP (!) 145/115     Pulse Rate (!) 154     Resp (!) 28     Temp 98 F (36.7 C)     Temp Source Oral     SpO2 98 %     Weight 188 lb  (85.3 kg)     Height 5\' 1"  (1.549 m)     Head Circumference      Peak Flow      Pain Score 2     Pain Loc      Pain Edu?      Excl. in [place]?     Most recent vital signs: Vitals:   06/26/22 1400 06/26/22 1430  BP: (!) 126/101 (!) 128/104  Pulse: (!) 130 (!) 130  Resp: (!) 25 (!) 28  Temp:    SpO2: 99% 100%    General: Awake, no distress.  CV:  Good peripheral perfusion.  Resp:  Normal effort.  Abd:  No distention.  Other:  There remains a residual erythematous diffuse mild rash to the abdomen and torso.  His abdomen is soft and nontender deep palpation all quadrants.  He is hypertensive 140s over 100 and tachycardic from 140s to 150s and afebrile.  He appears comfortable and nontoxic.  He has no meningismus.  He is moving all extremities with full active range of motion.  His lungs are clear without wheezing or focality, no rales.  He appears euvolemic.   ED Results / Procedures / Treatments   Labs (all labs ordered are listed, but only abnormal results are displayed) Labs Reviewed  COMPREHENSIVE METABOLIC PANEL - Abnormal; Notable  for the following components:      Result Value   CO2 21 (*)    Glucose, Bld 118 (*)    BUN 24 (*)    Calcium 8.7 (*)    Albumin 3.4 (*)    AST 87 (*)    ALT 242 (*)    Alkaline Phosphatase 145 (*)    Anion gap 4 (*)    All other components within normal limits  URINALYSIS, ROUTINE W REFLEX MICROSCOPIC - Abnormal; Notable for the following components:   Color, Urine STRAW (*)    APPearance CLEAR (*)    All other components within normal limits  PROTIME-INR - Abnormal; Notable for the following components:   Prothrombin Time 25.4 (*)    INR 2.3 (*)    All other components within normal limits  APTT - Abnormal; Notable for the following components:   aPTT 60 (*)    All other components within normal limits  CULTURE, BLOOD (ROUTINE X 2)  CULTURE, BLOOD (ROUTINE X 2)  LACTIC ACID, PLASMA  CBC WITH DIFFERENTIAL/PLATELET  CK  LACTIC ACID,  PLASMA  TROPONIN I (HIGH SENSITIVITY)  TROPONIN I (HIGH SENSITIVITY)     I ordered and reviewed the above labs they are notable for his liver enzymes are elevated with an ALT in the 200 and AST in the 80s.  EKG  ED ECG REPORT I, Lucillie Garfinkel, the attending physician, personally viewed and interpreted this ECG.   Date: 06/26/2022  EKG Time: 1152  Rate: 153  Rhythm: sinus tachycardia more likely than SVT given rate less than 180, ranging from 140s to 150 on cardiac monitoring, though no obviously identified P waves can consider atrial fibrillation with RVR as well.  Axis: nl  Intervals:none  ST&T Change: no stemi    RADIOLOGY I independently reviewed and interpreted chest x-ray and see no obvious focality or pneumothorax   PROCEDURES:  Critical Care performed: No  Procedures   MEDICATIONS ORDERED IN ED: Medications  sodium chloride 0.9 % bolus 1,000 mL (0 mLs Intravenous Stopped 06/26/22 1337)  sodium chloride 0.9 % bolus 1,000 mL (0 mLs Intravenous Stopped 06/26/22 1416)  sodium chloride 0.9 % bolus 1,000 mL (1,000 mLs Intravenous New Bag/Given 06/26/22 1436)     IMPRESSION / MDM / ASSESSMENT AND PLAN / ED COURSE  I reviewed the triage vital signs and the nursing notes.                                Patient's presentation is most consistent with acute presentation with potential threat to life or bodily function.  Differential diagnosis includes, but is not limited to, sinus tachycardia versus tachydysrhythmia, dehydration or electrolyte derangement, heat related injury, heat rash, infection, considered but less likely sepsis, meningitis, endocarditis given overall well appearance.   The patient is on the cardiac monitor to evaluate for evidence of arrhythmia and/or significant heart rate changes.  MDM: This is a patient who came at home for [place] who is spent much time in the heat and developed a rash whole body that is rapidly improving since coming home today  [place].  He has a tachydysrhythmia but is normotensive.  He has no focal infectious symptoms and overall looks very well.  Will give fluid hydration reassess heart rate.  I considered more emergent pathologies for his rash including endocarditis, meningitis, sepsis, SJS/TEN, drug reaction or allergic reaction but given no new exposures and no  focal infectious symptoms, nontoxic appearance, I think these are less likely.  LFTs are elevated.  He does not drink alcohol.  He has no tenderness to the abdomen or right upper quadrant.  I ordered a right upper quadrant ultrasound.  If he did have heat related injury, this could be a result.   His heart rate has come down to the 120s to 130s with 2 L of fluids and remains normotensive and comfortable.  I will order another liter of crystalloid.  Blood work aside from his LFTs are otherwise unremarkable initial troponin is negative, CK within normal limits, no AKI or electrolyte derangement.  He does not appear to be in acute withdrawal or other toxidrome.  He does not appear septic.  His white blood cell count is normal.  INR is therapeutic or near therapeutic at 2.3 so I doubt PE.  At the time of signout, will continue to assess for resolution and normalization of vital signs, he is still pending ultrasound of the right upper quadrant & repeat troponin.     FINAL CLINICAL IMPRESSION(S) / ED DIAGNOSES   Final diagnoses:  Tachycardia  Skin rash     Rx / DC Orders   ED Discharge Orders     None        Note:  This document was prepared using Dragon voice recognition software and may include unintentional dictation errors.    Lucillie Garfinkel, MD 06/26/22 1440

## 2022-06-26 NOTE — Sedation Documentation (Signed)
Pt shocked at 200J

## 2022-06-26 NOTE — ED Notes (Signed)
EDP at bedside  

## 2022-06-26 NOTE — ED Triage Notes (Addendum)
Patient presents with rash from head to toe that started 06/08/22 while in [place]. Reports arrived here from [place] 06/18/22. Patient also presents with HTN and tachycardia.   C/o facial pressure  Takes warfarin

## 2022-06-27 ENCOUNTER — Inpatient Hospital Stay (HOSPITAL_COMMUNITY)
Admit: 2022-06-27 | Discharge: 2022-06-27 | Disposition: A | Discharge status: Home / Self Care | Payer: BC Managed Care – PPO | Attending: Internal Medicine | Admitting: Internal Medicine

## 2022-06-27 DIAGNOSIS — R7989 Other specified abnormal findings of blood chemistry: Secondary | ICD-10-CM

## 2022-06-27 DIAGNOSIS — I471 Supraventricular tachycardia, unspecified: Principal | ICD-10-CM

## 2022-06-27 DIAGNOSIS — R079 Chest pain, unspecified: Secondary | ICD-10-CM | POA: Diagnosis not present

## 2022-06-27 DIAGNOSIS — E872 Acidosis, unspecified: Secondary | ICD-10-CM | POA: Diagnosis not present

## 2022-06-27 DIAGNOSIS — R9431 Abnormal electrocardiogram [ECG] [EKG]: Secondary | ICD-10-CM

## 2022-06-27 LAB — BLOOD GAS, VENOUS
Acid-Base Excess: 1.3 mmol/L (ref 0.0–2.0)
Bicarbonate: 25.9 mmol/L (ref 20.0–28.0)
O2 Saturation: 85.9 %
Patient temperature: 37
pCO2, Ven: 40 mmHg — ABNORMAL LOW (ref 44–60)
pH, Ven: 7.42 (ref 7.25–7.43)
pO2, Ven: 56 mmHg — ABNORMAL HIGH (ref 32–45)

## 2022-06-27 LAB — COMPREHENSIVE METABOLIC PANEL
ALT: 169 U/L — ABNORMAL HIGH (ref 0–44)
AST: 50 U/L — ABNORMAL HIGH (ref 15–41)
Albumin: 2.9 g/dL — ABNORMAL LOW (ref 3.5–5.0)
Alkaline Phosphatase: 112 U/L (ref 38–126)
Anion gap: 4 — ABNORMAL LOW (ref 5–15)
BUN: 25 mg/dL — ABNORMAL HIGH (ref 6–20)
CO2: 20 mmol/L — ABNORMAL LOW (ref 22–32)
Calcium: 8 mg/dL — ABNORMAL LOW (ref 8.9–10.3)
Chloride: 112 mmol/L — ABNORMAL HIGH (ref 98–111)
Creatinine, Ser: 0.87 mg/dL (ref 0.61–1.24)
GFR, Estimated: >60 mL/min (ref >60)
Glucose, Bld: 106 mg/dL — ABNORMAL HIGH (ref 70–99)
Potassium: 3.9 mmol/L (ref 3.5–5.1)
Sodium: 136 mmol/L (ref 135–145)
Total Bilirubin: 0.3 mg/dL (ref 0.3–1.2)
Total Protein: 6.4 g/dL — ABNORMAL LOW (ref 6.5–8.1)

## 2022-06-27 LAB — CBC
HCT: 41 % (ref 39.0–52.0)
Hemoglobin: 13.5 g/dL (ref 13.0–17.0)
MCH: 29.8 pg (ref 26.0–34.0)
MCHC: 32.9 g/dL (ref 30.0–36.0)
MCV: 90.5 fL (ref 80.0–100.0)
Platelets: 187 10*3/uL (ref 150–400)
RBC: 4.53 MIL/uL (ref 4.22–5.81)
RDW: 13.9 % (ref 11.5–15.5)
WBC: 5.7 10*3/uL (ref 4.0–10.5)
nRBC: 0 % (ref 0.0–0.2)

## 2022-06-27 LAB — ACETAMINOPHEN LEVEL: Acetaminophen (Tylenol), Serum: <10 ug/mL — ABNORMAL LOW (ref 10–30)

## 2022-06-27 LAB — SALICYLATE LEVEL: Salicylate Lvl: <7 mg/dL — ABNORMAL LOW (ref 7.0–30.0)

## 2022-06-27 LAB — HIV ANTIBODY (ROUTINE TESTING W REFLEX): HIV Screen 4th Generation wRfx: NONREACTIVE

## 2022-06-27 LAB — MRSA NEXT GEN BY PCR, NASAL: MRSA by PCR Next Gen: NOT DETECTED

## 2022-06-27 LAB — BETA-HYDROXYBUTYRIC ACID: Beta-Hydroxybutyric Acid: 0.08 mmol/L (ref 0.05–0.27)

## 2022-06-27 LAB — RPR
RPR Ser Ql: REACTIVE — AB
RPR Titer: 1:64 {titer}

## 2022-06-27 LAB — PROTIME-INR
INR: 2.5 — ABNORMAL HIGH (ref 0.8–1.2)
Prothrombin Time: 27.1 seconds — ABNORMAL HIGH (ref 11.4–15.2)

## 2022-06-27 LAB — D-DIMER, QUANTITATIVE: D-Dimer, Quant: 0.61 ug/mL-FEU — ABNORMAL HIGH (ref 0.00–0.50)

## 2022-06-27 LAB — ETHANOL: Alcohol, Ethyl (B): <10 mg/dL (ref <10)

## 2022-06-27 MED ORDER — METOPROLOL SUCCINATE ER 25 MG PO TB24
12.5000 mg | ORAL_TABLET | Freq: Every day | ORAL | Status: DC
  Administered 2022-06-27: 12.5 mg via ORAL
  Filled 2022-06-27: qty 0.5

## 2022-06-27 MED ORDER — WARFARIN SODIUM 4 MG PO TABS
4.0000 mg | ORAL_TABLET | Freq: Every day | ORAL | Status: DC
  Administered 2022-06-27: 4 mg via ORAL
  Filled 2022-06-27 (×2): qty 1

## 2022-06-27 MED ORDER — METOPROLOL SUCCINATE ER 25 MG PO TB24: 12.5000 mg | ORAL_TABLET | Freq: Every day | ORAL | 11 refills | Status: AC

## 2022-06-27 MED ORDER — AMIODARONE HCL 200 MG PO TABS: 200.0000 mg | ORAL_TABLET | Freq: Every day | ORAL | Status: DC

## 2022-06-27 MED ORDER — PERFLUTREN LIPID MICROSPHERE
1.0000 mL | INTRAVENOUS | Status: AC | PRN
  Administered 2022-06-27: 3 mL via INTRAVENOUS

## 2022-06-27 MED ORDER — WARFARIN SODIUM 2 MG PO TABS
2.0000 mg | ORAL_TABLET | ORAL | Status: AC
  Administered 2022-06-27: 2 mg via ORAL
  Filled 2022-06-27: qty 1

## 2022-06-27 MED ORDER — WARFARIN - PHARMACIST DOSING INPATIENT: Freq: Every day | Status: DC

## 2022-06-27 MED ORDER — AMIODARONE HCL 200 MG PO TABS: 200.0000 mg | ORAL_TABLET | Freq: Two times a day (BID) | ORAL | 0 refills | Status: AC

## 2022-06-27 MED ORDER — CHLORHEXIDINE GLUCONATE CLOTH 2 % EX PADS
6.0000 | MEDICATED_PAD | Freq: Every day | CUTANEOUS | Status: DC
  Administered 2022-06-26 – 2022-06-27 (×2): 6 via TOPICAL

## 2022-06-27 MED ORDER — AMIODARONE HCL 200 MG PO TABS: 200.0000 mg | ORAL_TABLET | Freq: Every day | ORAL | 11 refills | Status: AC

## 2022-06-27 MED ORDER — AMIODARONE HCL 200 MG PO TABS
200.0000 mg | ORAL_TABLET | Freq: Two times a day (BID) | ORAL | Status: DC
  Administered 2022-06-27: 200 mg via ORAL
  Filled 2022-06-27: qty 1

## 2022-06-27 NOTE — Assessment & Plan Note (Signed)
2/2 SVT. Cardiology consult for ?ischemic eval.

## 2022-06-27 NOTE — Assessment & Plan Note (Addendum)
Pt admitted to stepdown continued on amiodarone drip.  Cont PRN metoprolol. Cardiology consulted in ed.  We will send AM message.  Pt is on anticoagulation with coumadin and pharmacy consult requested.

## 2022-06-27 NOTE — Assessment & Plan Note (Signed)
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

## 2022-06-27 NOTE — Consult Note (Signed)
CARDIOLOGY CONSULT NOTE               Patient ID: Brent Reynolds MRN: [id number] DOB/AGE: [birth year] 57 y.o.  Admit date: 06/26/2022 Referring Physician Dr Oran Rein hospitalist Primary Physician Dr. 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  Patient has been maintained on anticoagulation with Coumadin since and has done reasonably well presented to emergency room with rapid heart beating tachycardia weakness fatigue and relative hypotension no chest pain.  Patient was transition to amiodarone and metoprolol and since converted after cardioversion in the emergency room from what appeared to be SVT at a rate of 160 now maintaining sinus rhythm feels reasonably well  Review of systems complete and found to be negative unless listed above     Past Medical History:  Diagnosis Date   Hypertension     History reviewed. No pertinent surgical history.  Medications Prior to Admission  Medication Sig Dispense Refill Last Dose   amoxicillin (AMOXIL) 500 MG capsule Take 2,000 mg by mouth once.  TAKE 4 CAPSULES BY MOUTH 1 HOUR PRIOR APPT.   prn at prn   fluticasone (FLONASE) 50 MCG/ACT nasal spray Place 1 spray into both nostrils 2 (two) times daily as needed for allergies.   prn at prn   losartan (COZAAR) 50 MG tablet Take 50 mg by mouth daily.   06/26/2022 at 0900   warfarin (COUMADIN) 4 MG tablet Take 4 mg by mouth daily. At 8 PM   06/25/2022 at 2000   Social History   Socioeconomic History   Marital status: Married    Spouse name: Not on file   Number of children: Not on file   Years of education: Not on file   Highest education level: Not on file  Occupational History   Not on file  Tobacco Use   Smoking status:  Never   Smokeless tobacco: Never  Vaping Use   Vaping Use: Never used  Substance and Sexual Activity   Alcohol use: Never   Drug use: Never   Sexual activity: Not on file  Other Topics Concern   Not on file  Social History Narrative   Not on file   Social Determinants of Health   Financial Resource Strain: Not on file  Food Insecurity: Not on file  Transportation Needs: Not on file  Physical Activity: Not on file  Stress: Not on file  Social Connections: Not on file  Intimate Partner Violence: Not on file    History reviewed. No pertinent family history.    Review of systems complete and found to be negative unless listed above      PHYSICAL EXAM  General: Well developed, well nourished, in no acute distress HEENT:  Normocephalic and atramatic Neck:  No JVD.  Lungs: Clear bilaterally to auscultation and percussion. Heart: HRRR . Normal S1 and S2 without gallops or 2/6 sem murmurs.  Clinical valve clicks Abdomen: Bowel sounds are positive, abdomen soft and non-tender  Msk:  Back normal, normal gait. Normal strength and tone for age. Extremities: No clubbing, cyanosis or edema.   Neuro: Alert and oriented X 3. Psych:  Good affect, responds appropriately  Labs:   Lab Results  Component Value Date   WBC 5.7 06/27/2022   HGB 13.5 06/27/2022  HCT 41.0 06/27/2022   MCV 90.5 06/27/2022   PLT 187 06/27/2022    Recent Labs  Lab 06/27/22 0520  NA 136  K 3.9  CL 112*  CO2 20*  BUN 25*  CREATININE 0.87  CALCIUM 8.0*  PROT 6.4*  BILITOT 0.3  ALKPHOS 112  ALT 169*  AST 50*  GLUCOSE 106*   Lab Results  Component Value Date   CKTOTAL 137 06/26/2022   No results found for: "CHOL" No results found for: "HDL" No results found for: "LDLCALC" No results found for: "TRIG" No results found for: "CHOLHDL" No results found for: "LDLDIRECT"    Radiology: CT Angio Chest PE W and/or Wo Contrast  Result Date: 06/26/2022 CLINICAL DATA:  Diffuse rash EXAM: CT  ANGIOGRAPHY CHEST WITH CONTRAST TECHNIQUE: Multidetector CT imaging of the chest was performed using the standard protocol during bolus administration of intravenous contrast. Multiplanar CT image reconstructions and MIPs were obtained to evaluate the vascular anatomy. RADIATION DOSE REDUCTION: This exam was performed according to the departmental dose-optimization program which includes automated exposure control, adjustment of the mA and/or kV according to patient size and/or use of iterative reconstruction technique. CONTRAST:  72mL OMNIPAQUE IOHEXOL 350 MG/ML SOLN COMPARISON:  None Available. FINDINGS: Cardiovascular: An artificial aortic valve is seen. Satisfactory opacification of the pulmonary arteries to the segmental level. No evidence of pulmonary embolism. Normal heart size. No pericardial effusion. Mediastinum/Nodes: No enlarged mediastinal, hilar, or axillary lymph nodes. Thyroid gland, trachea, and esophagus demonstrate no significant findings. Lungs/Pleura: Very mild atelectasis is seen within the posterior aspect of the left lung base. There is mild elevation of the right hemidiaphragm without evidence of an acute infiltrate, pleural effusion or pneumothorax. Upper Abdomen: No acute abnormality. Musculoskeletal: Multiple sternal wires are noted. No acute osseous abnormality is identified. Review of the MIP images confirms the above findings. IMPRESSION: 1. No evidence of pulmonary embolism or other acute intrathoracic process. 2. Evidence of prior aortic valve replacement. Electronically Signed   By: Virgina Norfolk M.D.   On: 06/26/2022 22:50   US ABDOMEN LIMITED RUQ (LIVER/GB)  Result Date: 06/26/2022 CLINICAL DATA:  Elevated liver function test. EXAM: ULTRASOUND ABDOMEN LIMITED RIGHT UPPER QUADRANT COMPARISON:  None Available. FINDINGS: Gallbladder: No gallstones or wall thickening visualized. No sonographic Murphy sign noted by sonographer. Common bile duct: Diameter: 2.6 mm.  Normal  diameter Liver: Mild increased echogenicity. No focal lesion. Portal vein is patent on color Doppler imaging with normal direction of blood flow towards the liver. Other: None. IMPRESSION: 1. Negative for gallstones or biliary obstruction. 2. Mild hepatic steatosis. Electronically Signed   By: Suzy Bouchard M.D.   On: 06/26/2022 14:50   DG Chest Port 1 View  Result Date: 06/26/2022 CLINICAL DATA:  Questionable sepsis. EXAM: PORTABLE CHEST 1 VIEW COMPARISON:  07/03/2019 FINDINGS: The lungs are clear without focal pneumonia, edema, pneumothorax or pleural effusion. Cardiopericardial silhouette is at upper limits of normal for size. The visualized bony structures of the thorax are unremarkable. Telemetry leads overlie the chest. IMPRESSION: No active disease. Electronically Signed   By: Misty Stanley M.D.   On: 06/26/2022 13:03    EKG: SVT rate of 160 narrow complex Follow-up EKG normal sinus rhythm rate of 60 nonspecific ST-T wave changes  ASSESSMENT AND PLAN:  SVT no complex History of bicuspid valve Aortic valve replacement mechanical Aortic root aneurysm repair replacement Chronic anticoagulation Coumadin for mechanical mitral valve Hypertension  Plan Transition from IV amiodarone to p.o. 200 twice a day Continue  very low-dose metoprolol 12.5 succinate daily Continue diltiazem therapy Continue Coumadin therapy for anticoagulation for mechanical aortic valve Agree with losartan therapy as needed for hypertension management Patient should follow-up with cardiology 1 to 2 weeks Patient can probably be discharged safely today  Signed: Yolonda Kida MD 06/27/2022, 12:44 PM

## 2022-06-27 NOTE — Assessment & Plan Note (Signed)
?   Etiology, Lactic acid negative. ETOH level pending. 2/2 to Abnormal LFT.

## 2022-06-27 NOTE — Progress Notes (Signed)
ANTICOAGULATION CONSULT NOTE - Initial Consult  Pharmacy Consult for Warfarin  Indication:  A Fib, aortic heart Valve replacement  No Known Allergies  Patient Measurements: Height: 5\' 1"  (154.9 cm) Weight: 84.1 kg (185 lb 6.5 oz) IBW/kg (Calculated) : 52.3 Heparin Dosing Weight:   Vital Signs: Temp: 98.3 F (36.8 C) (03/16 2315) Temp Source: Oral (03/16 2315) BP: 112/87 (03/16 2345) Pulse Rate: 67 (03/16 2345)  Labs: Recent Labs    06/26/22 1202 06/26/22 1414  HGB 15.5  --   HCT 47.0  --   PLT 202  --   APTT 60*  --   LABPROT 25.4*  --   INR 2.3*  --   CREATININE 0.88  --   CKTOTAL 137  --   TROPONINIHS 10 15    Estimated Creatinine Clearance: 87.2 mL/min (by C-G formula based on SCr of 0.88 mg/dL).   Medical History: Past Medical History:  Diagnosis Date   Hypertension     Medications:  Medications Prior to Admission  Medication Sig Dispense Refill Last Dose   amoxicillin (AMOXIL) 500 MG capsule Take 2,000 mg by mouth once.  TAKE 4 CAPSULES BY MOUTH 1 HOUR PRIOR APPT.   prn at prn   fluticasone (FLONASE) 50 MCG/ACT nasal spray Place 1 spray into both nostrils 2 (two) times daily as needed for allergies.   prn at prn   losartan (COZAAR) 50 MG tablet Take 50 mg by mouth daily.   06/26/2022 at 0900   warfarin (COUMADIN) 4 MG tablet Take 4 mg by mouth daily. At 8 PM   06/25/2022 at 2000    Assessment: Pharmacy consulted to dose warfarin in this 57 year old male admitted with tachycardia and atrial flutter.  Pt has hx of aortic heart valve replacement.  Pt was on warfarin 4 mg PO daily PTA, last dose on 3/15.  3/16:  INR @ 1202 = 2.3  Goal of Therapy:  INR :  2.5 - 3.5     Plan:  3/16:  INR @ 1202 = 2.3 , SUBtherapeutic Pt was on warfarin 4 mg PO daily PTA, last dose on 3/15. Will give warfarin 6 mg PO X 1 on 3/17 @ ~ 0030  Will continue warfarin 4 mg PO daily on 3/17 @ 1600.  Recheck INR on 3/17 with AM labs.   Pessy Delamar D 06/27/2022,12:23 AM

## 2022-06-27 NOTE — Assessment & Plan Note (Addendum)
2/2 to volume issue or medications.  Abnormality is new on comparison from may 2023- 03/2022 at [facility]:   Will follow culture Cont iv hydration. Hepatitis panel.

## 2022-06-27 NOTE — Discharge Summary (Signed)
Physician Discharge Summary      Patient ID: Brent Reynolds MRN: [id number] DOB/AGE: 1965/12/26 57 y.o.  Admit date: 06/26/2022 Discharge date: 06/27/2022  Primary Discharge Diagnosis SVT Secondary Discharge Diagnosis SVT Mechanical aortic valve Coumadin therapy  Significant Diagnostic Studies: no  Consults: cardiology  Hospital Course: Patient presented to emergency room with SVT rate of 160 with hypotension weakness fatigue patient was cardioverted electrically placed on IV amiodarone as well as metoprolol patient was on diltiazem drip and after successful cardioversion was transferred to ICU patient was done reasonably well maintained sinus rhythm and adequate blood pressure feels reasonably well.  Patient feels well enough to go home states this is never happened before since he has had his aortic valve and aortic root replaced at the base done reasonably well is been compliant with his medications   Discharge Exam: Blood pressure (!) 135/97, pulse (!) 59, temperature 97.8 F (36.6 C), temperature source Oral, resp. rate 16, height 5\' 1"  (1.549 m), weight 84.3 kg, SpO2 98 %.   General appearance: appears stated age Resp: clear to auscultation bilaterally Cardio: regular rate and rhythm, S1, S2 normal, no murmur, click, rub or gallop GI: soft, non-tender; bowel sounds normal; no masses,  no organomegaly Extremities: extremities normal, atraumatic, no cyanosis or edema Pulses: 2+ and symmetric Skin: Skin color, texture, turgor normal. No rashes or lesions Neurologic: Alert and oriented X 3, normal strength and tone. Normal symmetric reflexes. Normal coordination and gait Heart exam with multiple aortic valve clicks from mechanical mitral valve Labs:   Lab Results  Component Value Date   WBC 5.7 06/27/2022   HGB 13.5 06/27/2022   HCT 41.0 06/27/2022   MCV 90.5 06/27/2022   PLT 187 06/27/2022    Recent Labs  Lab 06/27/22 0520  NA 136  K 3.9  CL 112*  CO2 20*   BUN 25*  CREATININE 0.87  CALCIUM 8.0*  PROT 6.4*  BILITOT 0.3  ALKPHOS 112  ALT 169*  AST 50*  GLUCOSE 106*      Radiology:  EKG: SVT rate of 160 narrow complex Follow-up EKG normal sinus rhythm rate of 60  FOLLOW UP PLANS AND APPOINTMENTS  Allergies as of 06/27/2022   No Known Allergies      Medication List     TAKE these medications    amiodarone 200 MG tablet Commonly known as: PACERONE Take 1 tablet (200 mg total) by mouth 2 (two) times daily for 14 days.   amiodarone 200 MG tablet Commonly known as: PACERONE Take 1 tablet (200 mg total) by mouth daily. Start taking on: July 12, 2022   amoxicillin 500 MG capsule Commonly known as: AMOXIL Take 2,000 mg by mouth once.  TAKE 4 CAPSULES BY MOUTH 1 HOUR PRIOR APPT.   fluticasone 50 MCG/ACT nasal spray Commonly known as: FLONASE Place 1 spray into both nostrils 2 (two) times daily as needed for allergies.   losartan 50 MG tablet Commonly known as: COZAAR Take 50 mg by mouth daily.   metoprolol succinate 25 MG 24 hr tablet Commonly known as: TOPROL-XL Take 0.5 tablets (12.5 mg total) by mouth daily.   warfarin 4 MG tablet Commonly known as: COUMADIN Take 4 mg by mouth daily. At 8 PM        Follow-up Information     Paraschos, Alexander, MD. Schedule an appointment as soon as possible for a visit in 2 week(s).   Specialty: Cardiology Contact information: [address] [facility]  [place] [address] [phone]                 BRING ALL MEDICATIONS WITH YOU TO FOLLOW UP APPOINTMENTS  Time spent with patient to include physician time:  Signed:  Yolonda Kida MD,  06/27/2022, 2:26 PM

## 2022-06-27 NOTE — Progress Notes (Signed)
Progress Note   Patient: Brent Reynolds [id number] DOB: 07-11-1965 DOA: 06/26/2022     1 DOS: the patient was seen and examined on 06/27/2022   Brief hospital course:  Pt is a 57 y/o Hispanic Spanish speaking male with h/o AV replacement and ascending aneurysm repair at [facility] on Coumadin coming to ED for Chest pain / SOB/ palpitation since his trip in [place] about 2-3 weeks ago. Pt also states his symptoms did start there in [place] and got worse. His palpitation today is worse along with SOB and chest pain. Pt also noticed a fine lacy rash that started in both his hands and has spread to his whole body. No Other symptoms or fever of GI or urinary issues. On arrival pt meets sepsis criteria and was in SVT with RH in 150's and was shocked with 200j x 2 and started on amiodarone and metoprolol per cardiology Dr.Callwood. Pt was given 3 L NS.  3/16 : Patient back in sinus rhythm continues to stay on amiodarone drip.  Overnight telemetry with no acute events.  EKG pending.  Cardiology eval pending at the time of note.  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  Rest of the workup including HIV, syphilis negative.   Assessment and Plan:  * Chest pain 2/2 SVT. Cardiology consult for ?ischemic eval. patient was started on amiodarone bolus with a drip.  Continues to stay on drip.  Now in sinus rhythm.  EKG pending.  Overnight telemetry with no acute events. -Cardiology to follow  Elevated LFTs-secondary to hepatitis A.  2/2 to volume issue or medications.  Abnormality is new on comparison from may 2023- 03/2022 at [facility]:  Will follow culture Cont iv hydration. Hepatitis panel-shows IgM antibodies.   Metabolic acidosis ? Etiology, Lactic acid negative. ETOH level pending. 2/2 to Abnormal LFT.  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  Patient with recent travel to [place]  Atrial fibrillation -telemetry with no A-fib.  Likely SVT ? If there is underlying a.fib or h./o a.fib in  past pt is on anticoagulation.  Unclear etiology or inciting cause for the SVT. Will defer to cardiology for further eval.   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   Sustained SVT Pt admitted to stepdown continued on amiodarone drip.  Cont PRN metoprolol. Cardiology consulted in ed.  We will send AM message.  Pt is on anticoagulation with coumadin and pharmacy consult requested.      Subjective: Patient seen and examined this morning.  Vital labs and imaging reviewed.  Overnight telemetry reviewed.  Patient stays asymptomatic with no complaint of chest pain.  Feeling better.  Labs positive for hep A IgM antibodies.  Physical Exam: Vitals:   06/27/22 0600 06/27/22 0700 06/27/22 0758 06/27/22 0800  BP: 111/83 103/79 120/87 120/87  Pulse: 64 60  66  Resp: 18 16  16   Temp:   97.8 F (36.6 C)   TempSrc:   Oral   SpO2: 95% 96%  98%  Weight:      Height:       Physical Exam HENT:     Head: Normocephalic.  Eyes:     Extraocular Movements: Extraocular movements intact.     Pupils: Pupils are equal, round, and reactive to light.  Cardiovascular:     Rate and Rhythm: Normal rate and regular rhythm.  Pulmonary:     Effort: Pulmonary effort is normal.  Abdominal:     General:  Abdomen is flat.     Palpations: Abdomen is soft.  Musculoskeletal:        General: Normal range of motion.     Cervical back: Normal range of motion.  Skin:    General: Skin is warm.  Neurological:     General: No focal deficit present.     Mental Status: He is alert.  Psychiatric:        Mood and Affect: Mood normal.        Behavior: Behavior normal.     Data Reviewed:  Results are pending, will review when available.  Family Communication: None y bedside.   Disposition: Status is: Inpatient Remains inpatient appropriate because: SVT and Hep-A   Planned Discharge Destination: Home    Time spent: 35 minutes  Author: Oran Rein, MD 06/27/2022 11:40 AM  For on call review [email or web address].

## 2022-06-27 NOTE — Assessment & Plan Note (Signed)
?   If there is underlying a.fib or h./o a.fib in past pt is on anticoagulation.  Unclear etiology or inciting cause for the SVT. Will defer to cardiology for further eval.

## 2022-06-28 LAB — ECHOCARDIOGRAM COMPLETE
AR max vel: 1.43 cm2
AV Area VTI: 1.76 cm2
AV Area mean vel: 1.39 cm2
AV Mean grad: 12.5 mmHg
AV Peak grad: 22.4 mmHg
Ao pk vel: 2.37 m/s
Area-P 1/2: 3.34 cm2
Calc EF: 63.6 %
Height: 61 in
S' Lateral: 3.1 cm
Single Plane A2C EF: 57.5 %
Single Plane A4C EF: 68.1 %
Weight: 2973.56 oz

## 2022-06-28 LAB — HEPATITIS PANEL, ACUTE
HCV Ab: NONREACTIVE
Hep A IgM: REACTIVE — AB
Hep B C IgM: NONREACTIVE
Hepatitis B Surface Ag: NONREACTIVE

## 2022-06-28 LAB — HEMOGLOBIN A1C
Hgb A1c MFr Bld: 5.9 % — ABNORMAL HIGH (ref 4.8–5.6)
Mean Plasma Glucose: 123 mg/dL

## 2022-06-28 LAB — GLUCOSE, CAPILLARY: Glucose-Capillary: 104 mg/dL — ABNORMAL HIGH (ref 70–99)

## 2022-06-29 LAB — T.PALLIDUM AB, TOTAL: T Pallidum Abs: REACTIVE — AB

## 2022-06-30 ENCOUNTER — Telehealth (HOSPITAL_COMMUNITY): Payer: Self-pay

## 2022-07-01 ENCOUNTER — Ambulatory Visit: Payer: BC Managed Care – PPO | Admitting: Family Medicine

## 2022-07-01 ENCOUNTER — Encounter: Payer: Self-pay | Admitting: Family Medicine

## 2022-07-01 DIAGNOSIS — A5149 Other secondary syphilitic conditions: Secondary | ICD-10-CM

## 2022-07-01 DIAGNOSIS — Z113 Encounter for screening for infections with a predominantly sexual mode of transmission: Secondary | ICD-10-CM

## 2022-07-01 LAB — HM HIV SCREENING LAB: HM HIV Screening: NEGATIVE

## 2022-07-01 MED ORDER — PENICILLIN G BENZATHINE 1200000 UNIT/2ML IM SUSY
2.4000 10*6.[IU] | PREFILLED_SYRINGE | Freq: Once | INTRAMUSCULAR | Status: AC
  Administered 2022-07-01: 2.4 10*6.[IU] via INTRAMUSCULAR

## 2022-07-01 MED ORDER — PENICILLIN G BENZATHINE 1200000 UNIT/2ML IM SUSY: 2.4000 10*6.[IU] | PREFILLED_SYRINGE | INTRAMUSCULAR | Status: DC

## 2022-07-01 NOTE — Progress Notes (Signed)
[facility] Department STI clinic/screening visit  Subjective:  KEANEN THIESEN is a 57 y.o. male being seen today for an STI screening visit. The patient reports they do have symptoms.    Patient has the following medical conditions:   Patient Active Problem List   Diagnosis Date Noted   Elevated LFTs 06/27/2022   Sustained SVT 0000000   Metabolic acidosis 0000000   Chest pain 06/26/2022   Aortic valve replaced 10/02/2021   Atrial fibrillation ([place]) 08/02/2013   Chief Complaint  Patient presents with   SEXUALLY TRANSMITTED DISEASE    Screening and treatment for Syphilis    HPI  Patient reports to clinic for syphilis treatment and STI testing.   Last HIV test per patient/review of record was No results found for: "HMHIVSCREEN"  Lab Results  Component Value Date   HIV Non Reactive 06/26/2022   Does the patient or their partner desires a pregnancy in the next year? No  Screening for MPX risk: Does the patient have an unexplained rash? Yes Is the patient MSM? No Does the patient endorse multiple sex partners or anonymous sex partners? No Did the patient have close or sexual contact with a person diagnosed with MPX? No Has the patient traveled outside the [place] where MPX is endemic? No Is there a high clinical suspicion for MPX-- evidenced by one of the following No  -Unlikely to be chickenpox  -Lymphadenopathy  -Rash that present in same phase of evolution on any given body part  See flowsheet for further details and programmatic requirements.   Immunization History  Administered Date(s) Administered   Influenza Inj Mdck Quad Pf 12/29/2021   Influenza Inj Mdck Quad With Preservative 03/20/2018   Influenza Split 02/01/2013   Influenza,inj,Quad PF,6+ Mos 11/28/2016, 12/10/2018, 02/05/2020, 06/17/2021   Influenza-Unspecified 12/31/2015, 03/20/2018   Tdap 02/05/2020     The following portions of the patient's history were reviewed and updated as  appropriate: allergies, current medications, past medical history, past social history, past surgical history and problem list.  Objective:  There were no vitals filed for this visit.  Physical Exam Vitals and nursing note reviewed.  Constitutional:      Appearance: Normal appearance.  HENT:     Head: Normocephalic and atraumatic.     Mouth/Throat:     Mouth: Mucous membranes are moist.     Pharynx: No oropharyngeal exudate or posterior oropharyngeal erythema.  Eyes:     General:        Right eye: No discharge.        Left eye: No discharge.     Conjunctiva/sclera:     Right eye: Right conjunctiva is not injected. No exudate.    Left eye: Left conjunctiva is not injected. No exudate. Pulmonary:     Effort: Pulmonary effort is normal.  Abdominal:     General: Abdomen is flat.     Palpations: Abdomen is soft. There is no hepatomegaly or mass.     Tenderness: There is no abdominal tenderness. There is no rebound.  Genitourinary:    Comments: Declined genital exam- asymptomatic Lymphadenopathy:     Cervical: No cervical adenopathy.     Upper Body:     Right upper body: No supraclavicular or axillary adenopathy.     Left upper body: No supraclavicular or axillary adenopathy.  Skin:    General: Skin is warm and dry.  Neurological:     Mental Status: He is alert and oriented to person, place, and time.  Assessment and Plan:  DEMEKO TRABUE is a 57 y.o. male presenting to the [facility] Department for STI screening  1. Secondary syphilis Patient states he has a rash on his bilateral palms that has been there for about 2 months. Patient denies any other STI symptoms. Consult with Doren Custard from the state. Treat x 1 with bicillin.   - penicillin g benzathine (BICILLIN LA) 1200000 UNIT/2ML injection 2.4 Million Units  2. Screening for venereal disease  - HIV [facility] - Syphilis Serology, [facility] - Chlamydia/GC NAA, Confirmation  Patient does have STI  symptoms Patient accepted all screenings including  urine GC/Chlamydia, and blood work for HIV/Syphilis. Patient meets criteria for HepB screening? No. Ordered? not applicable Patient meets criteria for HepC screening? No. Ordered? not applicable Recommended condom use with all sex Discussed importance of condom use for STI prevent  Treat positive test results per standing order. Discussed time line for State Lab results and that patient will be called with positive results and encouraged patient to call if he had not heard in 2 weeks Recommended repeat testing in 3 months with positive results. Recommended returning for continued or worsening symptoms.   Return if symptoms worsen or fail to improve.  Total time spent 20 minutes.  Sharlet Salina, [place]

## 2022-07-02 LAB — CULTURE, BLOOD (ROUTINE X 2)
Culture: NO GROWTH
Culture: NO GROWTH
Special Requests: ADEQUATE
Special Requests: ADEQUATE

## 2022-07-02 NOTE — Progress Notes (Signed)
Pt is here for STD screening.  Bicillin 2.4 MU given.  Pt tolerated well.  Windle Guard, RN

## 2022-07-05 LAB — CHLAMYDIA/GC NAA, CONFIRMATION
Chlamydia trachomatis, NAA: NEGATIVE
Neisseria gonorrhoeae, NAA: NEGATIVE

## 2024-02-09 ENCOUNTER — Other Ambulatory Visit: Payer: Self-pay | Admitting: Physician Assistant

## 2024-02-09 DIAGNOSIS — Z952 Presence of prosthetic heart valve: Secondary | ICD-10-CM

## 2024-02-09 DIAGNOSIS — Z9889 Other specified postprocedural states: Secondary | ICD-10-CM

## 2024-02-21 ENCOUNTER — Ambulatory Visit
Admission: RE | Admit: 2024-02-21 | Discharge: 2024-02-21 | Disposition: A | Discharge status: Home / Self Care | Source: Ambulatory Visit | Attending: Physician Assistant | Admitting: Physician Assistant

## 2024-02-21 DIAGNOSIS — Z952 Presence of prosthetic heart valve: Secondary | ICD-10-CM | POA: Insufficient documentation

## 2024-02-21 DIAGNOSIS — Z9889 Other specified postprocedural states: Secondary | ICD-10-CM | POA: Diagnosis present

## 2024-02-21 MED ORDER — IOHEXOL 350 MG/ML SOLN
75.0000 mL | Freq: Once | INTRAVENOUS | Status: AC | PRN
  Administered 2024-02-21: 75 mL via INTRAVENOUS
